# Patient Record
Sex: Male | Born: 1995 | Race: White | Hispanic: No | Smoking: Never smoker
Health system: Southern US, Community
[De-identification: ages and names within clinical notes are randomized; demographics above are authoritative.]

---

## 2018-04-01 ENCOUNTER — Ambulatory Visit
Admission: RE | Admit: 2018-04-01 | Discharge: 2018-04-01 | Disposition: A | Discharge status: Home / Self Care | Payer: No Typology Code available for payment source | Source: Ambulatory Visit | Attending: Nurse Practitioner | Admitting: Nurse Practitioner

## 2018-04-01 ENCOUNTER — Other Ambulatory Visit: Payer: Self-pay | Admitting: Nurse Practitioner

## 2018-04-01 DIAGNOSIS — Z021 Encounter for pre-employment examination: Secondary | ICD-10-CM

## 2021-01-30 ENCOUNTER — Ambulatory Visit (INDEPENDENT_AMBULATORY_CARE_PROVIDER_SITE_OTHER)

## 2021-01-30 ENCOUNTER — Encounter (HOSPITAL_COMMUNITY): Payer: Self-pay

## 2021-01-30 ENCOUNTER — Ambulatory Visit (HOSPITAL_COMMUNITY)
Admission: EM | Admit: 2021-01-30 | Discharge: 2021-01-30 | Disposition: A | Discharge status: Home / Self Care | Attending: Internal Medicine | Admitting: Internal Medicine

## 2021-01-30 ENCOUNTER — Other Ambulatory Visit: Payer: Self-pay

## 2021-01-30 DIAGNOSIS — S93491A Sprain of other ligament of right ankle, initial encounter: Secondary | ICD-10-CM

## 2021-01-30 DIAGNOSIS — M25571 Pain in right ankle and joints of right foot: Secondary | ICD-10-CM

## 2021-01-30 NOTE — ED Provider Notes (Signed)
[facility]    CSN: [id number] Arrival date & time: 01/30/21  1812      History   Chief Complaint Chief Complaint  Patient presents with   right ankle injury    HPI Jeff Sanchez is a 25 y.o. male.   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 No pertinent past medical history.  There are no problems to display for this patient.   History reviewed. No pertinent surgical history.     Home Medications    Prior to Admission medications   Not on File    Family History History reviewed. No pertinent family history.  Social History Social History   Tobacco Use   Smoking status: Never   Smokeless tobacco: Never     Allergies   Patient has no allergy information on record.   Review of Systems Review of Systems  Constitutional:  Negative for activity change, appetite change and fever.  HENT:  Negative for congestion.   Respiratory:  Negative for shortness of breath.   Cardiovascular:  Negative for chest pain.  Gastrointestinal:  Negative for abdominal pain.  Genitourinary:  Negative for difficulty urinating.  Musculoskeletal:        Right ankle pain  Skin:  Negative for wound.  Neurological:  Negative for numbness.    Physical Exam Triage Vital Signs ED Triage Vitals  Enc Vitals Group     BP 01/30/21 1834 128/79     Pulse Rate 01/30/21 1834 64     Resp  01/30/21 1834 18     Temp 01/30/21 1834 97.9 F (36.6 C)     Temp Source 01/30/21 1834 Oral     SpO2 01/30/21 1834 100 %     Weight --      Height --      Head Circumference --      Peak Flow --      Pain Score 01/30/21 1836 8     Pain Loc --      Pain Edu? --      Excl. in GC? --    No data found.  Updated Vital Signs BP 128/79 (BP Location: Left Arm)   Pulse 64   Temp 97.9 F (36.6 C) (Oral)   Resp 18   SpO2 100%   Visual Acuity Right Eye Distance:   Left Eye Distance:   Bilateral Distance:    Right Eye Near:   Left Eye Near:    Bilateral Near:     Physical Exam Constitutional:      General: He is not in acute distress.    Appearance: Normal appearance. He is not ill-appearing or toxic-appearing.  HENT:     Head: Normocephalic and atraumatic.  Cardiovascular:     Rate and Rhythm: Normal rate.  Pulmonary:     Effort: Pulmonary effort is normal.     Breath sounds:  Normal breath sounds.  Musculoskeletal:     Comments: Right Ankle: - Inspection: He has some slight swelling in the region of the sinus tarsi on the right.  He also has slight overlying ecchymosis in this area. - Palpation: No TTP at MT heads, no TTP at base of 5th MT, no TTP over cuboid, no tenderness over navicular prominence, he does have some tenderness palpation at the posterior aspect of his medial malleolus, no tenderness palpation of the lateral malleolus.  He does have tenderness palpation specifically over the region of the ATFL on the right. No sign of peroneal tendon subluxation or TTP. - Strength: Normal strength with dorsiflexion, plantarflexion, inversion, and eversion of foot; flexion and extension of toes b/l - ROM: Decreased range of motion in all fields secondary to pain, but is able to move in all directions. - Neuro/vasc: NV intact distally bilaterally - Special Tests: Negative anterior drawer, pain with inversion and aborted before endpoint reached, negative squeeze  Feet: no  deformity noted b/l.      Skin:    Capillary Refill: Capillary refill takes less than 2 seconds.  Neurological:     Mental Status: He is alert and oriented to person, place, and time.     UC Treatments / Results  Labs (all labs ordered are listed, but only abnormal results are displayed) Labs Reviewed - No data to display  EKG   Radiology DG Ankle Complete Right  Result Date: 01/30/2021 CLINICAL DATA:  Twisted ankle. EXAM: RIGHT ANKLE - COMPLETE 3+ VIEW COMPARISON:  None. FINDINGS: The ankle mortise is maintained. No acute ankle fracture or osteochondral lesion. No definite ankle joint effusion. The mid and hindfoot bony structures are intact. IMPRESSION: No acute bony findings. Electronically Signed   By: Rudie Meyer M.D.   On: 01/30/2021 19:37    Procedures Procedures (including critical care time)  Medications Ordered in UC Medications - No data to display  Initial Impression / Assessment and Plan / UC Course  I have reviewed the triage vital signs and the nursing notes.  Pertinent labs & imaging results that were available during my care of the patient were reviewed by me and considered in my medical decision making (see chart for details).     Patient is a previously healthy 25 year old male who presents with right ankle sprain following inversion injury today.  X-rays were negative for fracture.  Symptoms are most consistent with sprain.  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  He reports that he will be able to do some light duty work in the meantime.  We will have him use an ASO brace for at least the next month, but advised could take 6 to 8 weeks to have improvement in his pain.  He will start early range of motion exercises which she is familiar with from his previous injuries.  He was advised that he can follow-up with the sports medicine office in 1 month if he is not having any improvement in his symptoms or  would like to perform formal physical therapy.  Otherwise, he can use ice, ASO brace, Tylenol or ibuprofen as needed, home ankle range of motion exercises and strength exercises.  Advised to go to the emergency room if he has significant pain with numbness and tingling.  He was discharged home in stable condition.  Final Clinical Impressions(s) / UC Diagnoses   Final diagnoses:  Sprain of anterior talofibular  ligament of right ankle, initial encounter     Discharge Instructions      Your x-rays were negative for fracture.  You have an ankle sprain that we will treat with an ankle brace.  This will help protect it, wear it over the next 4 to 6 weeks especially while you are working.  Remember that this can take 6 to 8 weeks to feel significantly improved and back to normal.  Start early range of motion exercises in the next few days.  If you would like, you can follow-up at the sports medicine office in 1 month.  You can take Tylenol or ibuprofen as needed for pain.  He can also use ice over the next few days to help with swelling.  We will write you out of work until 8/22.     ED Prescriptions   None    PDMP not reviewed this encounter.   Takari Lundahl, Solmon Ice, DO 01/30/21 2026

## 2021-01-30 NOTE — Discharge Instructions (Addendum)
Your x-rays were negative for fracture.  You have an ankle sprain that we will treat with an ankle brace.  This will help protect it, wear it over the next 4 to 6 weeks especially while you are working.  Remember that this can take 6 to 8 weeks to feel significantly improved and back to normal.  Start early range of motion exercises in the next few days.  If you would like, you can follow-up at the sports medicine office in 1 month.  You can take Tylenol or ibuprofen as needed for pain.  He can also use ice over the next few days to help with swelling.  We will write you out of work until 8/22.

## 2021-01-30 NOTE — ED Triage Notes (Signed)
Pt c/o right ankle injury onset today during training for work. States it rolled but did not hear a "pop."

## 2022-04-28 IMAGING — DX DG ANKLE COMPLETE 3+V*R*
3 series · 3 of 3 positions shown · non-contrast
Comparison: None.

CLINICAL DATA: Twisted ankle.

EXAM:
RIGHT ANKLE - COMPLETE 3+ VIEW

[ankle ap]
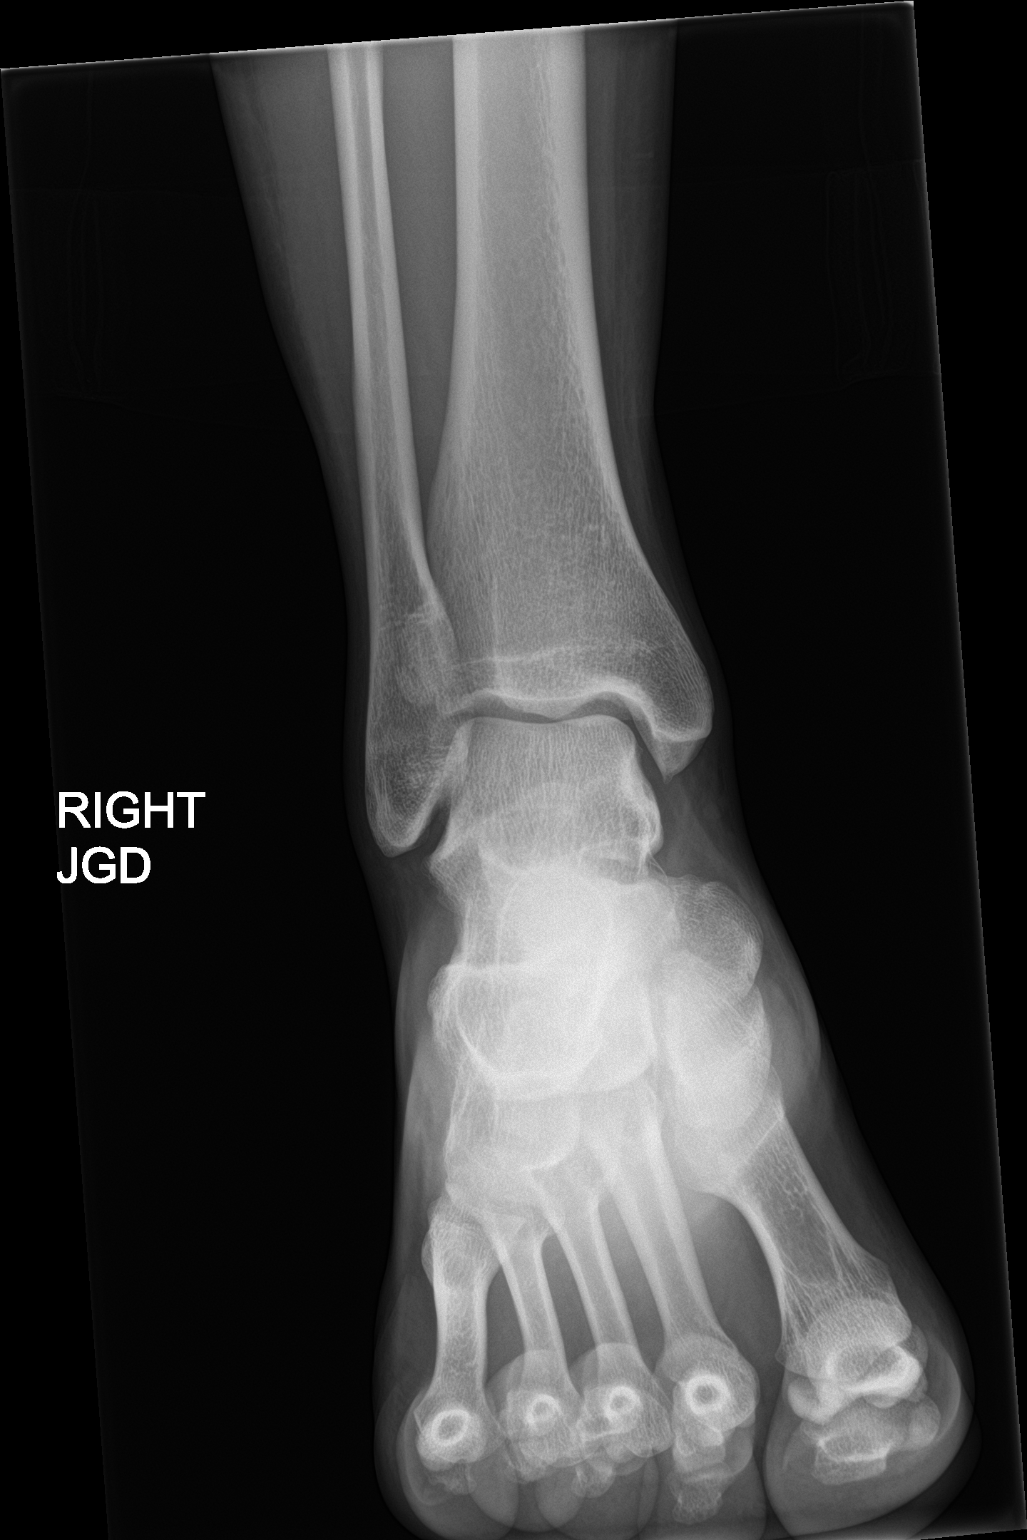

[ankle obl]
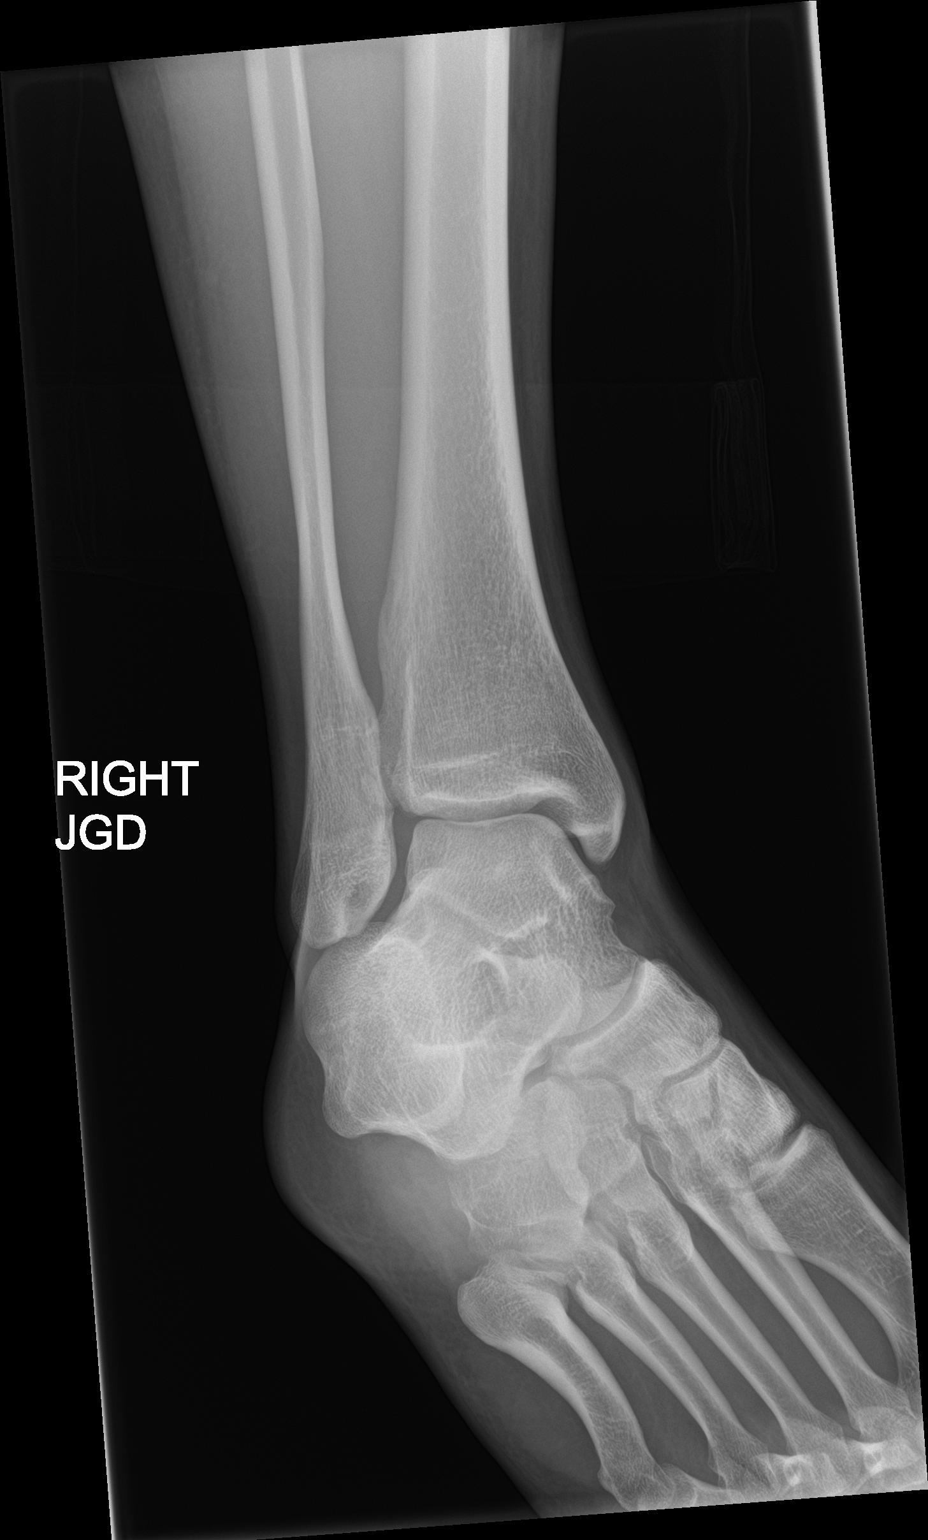

[ankle lat]
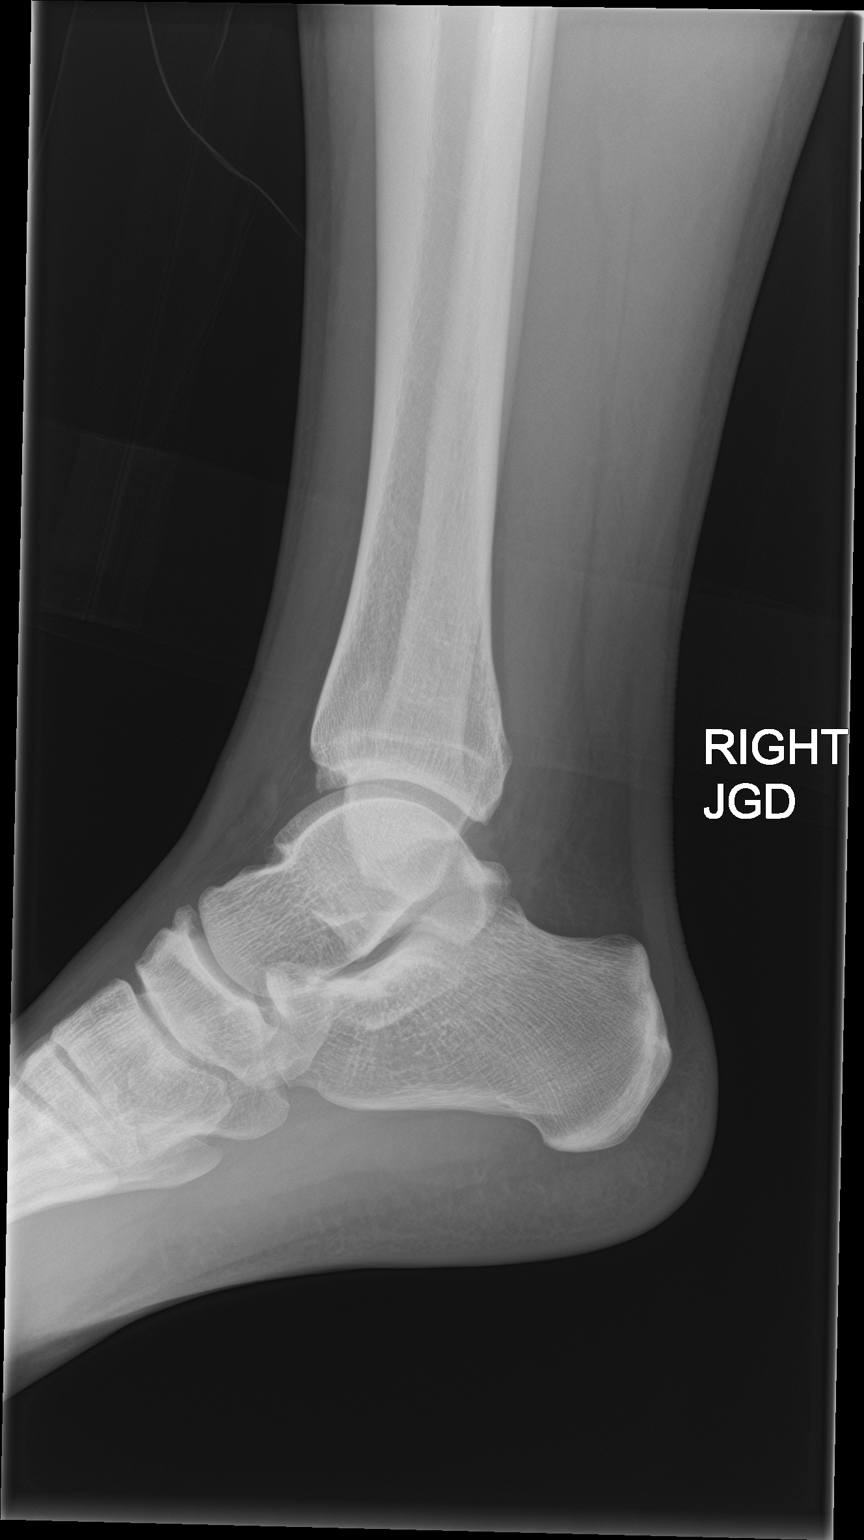

[3 of 3 positions shown; findings below may reference images not displayed]

FINDINGS: The ankle mortise is maintained. No acute ankle fracture or
osteochondral lesion. No definite ankle joint effusion.

The mid and hindfoot bony structures are intact.
IMPRESSION: No acute bony findings.
